# Patient Record
Sex: Male | Born: 1960 | Race: Black or African American | Hispanic: No | Marital: Single | Smoking: Never smoker
Health system: Southern US, Community
[De-identification: ages and names within clinical notes are randomized; demographics above are authoritative.]

---

## 2014-04-14 ENCOUNTER — Emergency Department (HOSPITAL_BASED_OUTPATIENT_CLINIC_OR_DEPARTMENT_OTHER): Payer: BC Managed Care – PPO

## 2014-04-14 ENCOUNTER — Emergency Department (HOSPITAL_BASED_OUTPATIENT_CLINIC_OR_DEPARTMENT_OTHER)
Admission: EM | Admit: 2014-04-14 | Discharge: 2014-04-14 | Disposition: A | Discharge status: Home / Self Care | Payer: BC Managed Care – PPO | Attending: Emergency Medicine | Admitting: Emergency Medicine

## 2014-04-14 ENCOUNTER — Encounter (HOSPITAL_BASED_OUTPATIENT_CLINIC_OR_DEPARTMENT_OTHER): Payer: Self-pay | Admitting: *Deleted

## 2014-04-14 DIAGNOSIS — Z880 Allergy status to penicillin: Secondary | ICD-10-CM | POA: Diagnosis not present

## 2014-04-14 DIAGNOSIS — K5731 Diverticulosis of large intestine without perforation or abscess with bleeding: Secondary | ICD-10-CM | POA: Diagnosis not present

## 2014-04-14 DIAGNOSIS — K625 Hemorrhage of anus and rectum: Secondary | ICD-10-CM | POA: Diagnosis present

## 2014-04-14 DIAGNOSIS — R52 Pain, unspecified: Secondary | ICD-10-CM

## 2014-04-14 LAB — COMPREHENSIVE METABOLIC PANEL
ALT: 35 U/L (ref 0–53)
ANION GAP: 13 (ref 5–15)
AST: 26 U/L (ref 0–37)
Albumin: 4.4 g/dL (ref 3.5–5.2)
Alkaline Phosphatase: 42 U/L (ref 39–117)
BUN: 10 mg/dL (ref 6–23)
CALCIUM: 9.3 mg/dL (ref 8.4–10.5)
CO2: 26 mEq/L (ref 19–32)
Chloride: 102 mEq/L (ref 96–112)
Creatinine, Ser: 1.2 mg/dL (ref 0.50–1.35)
GFR calc non Af Amer: 67 mL/min — ABNORMAL LOW (ref >90)
GFR, EST AFRICAN AMERICAN: 78 mL/min — AB (ref >90)
Glucose, Bld: 94 mg/dL (ref 70–99)
Potassium: 4 mEq/L (ref 3.7–5.3)
Sodium: 141 mEq/L (ref 137–147)
TOTAL PROTEIN: 7.8 g/dL (ref 6.0–8.3)
Total Bilirubin: 0.4 mg/dL (ref 0.3–1.2)

## 2014-04-14 LAB — OCCULT BLOOD X 1 CARD TO LAB, STOOL: Fecal Occult Bld: NEGATIVE

## 2014-04-14 LAB — CBC WITH DIFFERENTIAL/PLATELET
Basophils Absolute: 0.1 10*3/uL (ref 0.0–0.1)
Basophils Relative: 1 % (ref 0–1)
EOS ABS: 0.2 10*3/uL (ref 0.0–0.7)
EOS PCT: 3 % (ref 0–5)
HCT: 45.5 % (ref 39.0–52.0)
HEMOGLOBIN: 15.4 g/dL (ref 13.0–17.0)
Lymphocytes Relative: 45 % (ref 12–46)
Lymphs Abs: 3.1 10*3/uL (ref 0.7–4.0)
MCH: 27.3 pg (ref 26.0–34.0)
MCHC: 33.8 g/dL (ref 30.0–36.0)
MCV: 80.7 fL (ref 78.0–100.0)
MONOS PCT: 8 % (ref 3–12)
Monocytes Absolute: 0.5 10*3/uL (ref 0.1–1.0)
Neutro Abs: 3 10*3/uL (ref 1.7–7.7)
Neutrophils Relative %: 43 % (ref 43–77)
PLATELETS: 243 10*3/uL (ref 150–400)
RBC: 5.64 MIL/uL (ref 4.22–5.81)
RDW: 14.8 % (ref 11.5–15.5)
WBC: 6.8 10*3/uL (ref 4.0–10.5)

## 2014-04-14 MED ORDER — IOHEXOL 300 MG/ML  SOLN
100.0000 mL | Freq: Once | INTRAMUSCULAR | Status: AC | PRN
  Administered 2014-04-14: 100 mL via INTRAVENOUS

## 2014-04-14 NOTE — Discharge Instructions (Signed)
Diverticulosis Call Dr. Randa EvensEdwards tomorrow to schedule an office visit for within the next few weeks. Tell office staff that you were seen here and had a CT scan done here. Call your primary care physician tomorrow to arrange to get your blood pressure recheck within the next 3 weeks. Today's was elevated at 151/80. Diverticulosis is the condition that develops when small pouches (diverticula) form in the wall of your colon. Your colon, or large intestine, is where water is absorbed and stool is formed. The pouches form when the inside layer of your colon pushes through weak spots in the outer layers of your colon. CAUSES  No one knows exactly what causes diverticulosis. RISK FACTORS  Being older than 50. Your risk for this condition increases with age. Diverticulosis is rare in people younger than 40 years. By age 54, almost everyone has it.  Eating a low-fiber diet.  Being frequently constipated.  Being overweight.  Not getting enough exercise.  Smoking.  Taking over-the-counter pain medicines, like aspirin and ibuprofen. SYMPTOMS  Most people with diverticulosis do not have symptoms. DIAGNOSIS  Because diverticulosis often has no symptoms, health care providers often discover the condition during an exam for other colon problems. In many cases, a health care provider will diagnose diverticulosis while using a flexible scope to examine the colon (colonoscopy). TREATMENT  If you have never developed an infection related to diverticulosis, you may not need treatment. If you have had an infection before, treatment may include:  Eating more fruits, vegetables, and grains.  Taking a fiber supplement.  Taking a live bacteria supplement (probiotic).  Taking medicine to relax your colon. HOME CARE INSTRUCTIONS   Drink at least 6-8 glasses of water each day to prevent constipation.  Try not to strain when you have a bowel movement.  Keep all follow-up appointments. If you have had an  infection before:  Increase the fiber in your diet as directed by your health care provider or dietitian.  Take a dietary fiber supplement if your health care provider approves.  Only take medicines as directed by your health care provider. SEEK MEDICAL CARE IF:   You have abdominal pain.  You have bloating.  You have cramps.  You have not gone to the bathroom in 3 days. SEEK IMMEDIATE MEDICAL CARE IF:   Your pain gets worse.  Yourbloating becomes very bad.  You have a fever or chills, and your symptoms suddenly get worse.  You begin vomiting.  You have bowel movements that are bloody or black. MAKE SURE YOU:  Understand these instructions.  Will watch your condition.  Will get help right away if you are not doing well or get worse. Document Released: 02/04/2004 Document Revised: 05/14/2013 Document Reviewed: 04/03/2013 [facility] Patient Information 2015 [place]. This information is not intended to replace advice given to you by your health care provider. Make sure you discuss any questions you have with your health care provider.

## 2014-04-14 NOTE — ED Notes (Signed)
Patient transported to CT 

## 2014-04-14 NOTE — ED Provider Notes (Signed)
CSN: [id number]     Arrival date & time 04/14/14  1749 History  This chart was scribed for Keith SouSam Sevannah Madia, MD by Annye AsaAnna Dorsett, ED Scribe. This patient was seen in room MH05/[facility]05 and the patient's care was started at 6:09 PM.    Chief Complaint  Patient presents with  . Rectal Bleeding   The history is provided by the patient. No language interpreter was used.     HPI Comments: Keith Beltran is a 53 y.o. male who presents to the Emergency Department complaining of rectal bleeding and lower abdominal pain; he has no discomfort at present. He reports that on the morning of 04/10/14 he first noted bright red blood when wiping after a bowel movement; he has noticed blood in a similar manner with every bowel movement since. No gross blood noted in bowel movement, only blood on toilet paper with wiping  He reports 1 week of lower abdominal pain. His pain is described as "heavy" and is particularly noticeable with change in position (seated to standing, etc) and when firmly palpated (patient saw Dr. Modesto CharonWong just PTA; he had bloodwork at that office visit).  CBC which showed white count 6.1, hemoglobin 15.7 hematocrit 49.6, platelet count 231 He also notes lower back pain for one week. He denies appetite change or fever.  No pain at present. No lightheadedness. No treatment prior to coming here.  He denies any other health problems at present or history of health problems. He had an operation concerning his urinary function when he was 89 or 53 y/o; he is unsure what this was.   Patient denies smoking, drinking, or the use of illegal drugs.  History reviewed. No pertinent past medical history. History reviewed. No pertinent past surgical history. No family history on file. History  Substance Use Topics  . Smoking status: Never Smoker   . Smokeless tobacco: Not on file  . Alcohol Use: No    Review of Systems  Constitutional: Negative.   HENT: Negative.   Respiratory: Negative.   Cardiovascular:  Negative.   Gastrointestinal: Positive for abdominal pain and anal bleeding.  Musculoskeletal: Positive for back pain.  Skin: Negative.   Neurological: Negative.   Psychiatric/Behavioral: Negative.   All other systems reviewed and are negative.   Allergies  Penicillins  Home Medications   Prior to Admission medications   Not on File   BP 155/100 mmHg  Pulse 88  Temp(Src) 98.3 F (36.8 C) (Oral)  Resp 18  Ht 6\' 2"  (1.88 m)  Wt 266 lb (120.657 kg)  BMI 34.14 kg/m2  SpO2 97% Physical Exam  Constitutional: He appears well-developed and well-nourished.  HENT:  Head: Normocephalic and atraumatic.  Eyes: Conjunctivae are normal. Pupils are equal, round, and reactive to light.  Neck: Neck supple. No tracheal deviation present. No thyromegaly present.  Cardiovascular: Normal rate and regular rhythm.   No murmur heard. Pulmonary/Chest: Effort normal and breath sounds normal.  Abdominal: Soft. Bowel sounds are normal. He exhibits no distension. There is no tenderness.  Obese, umbilical hernia, soft nontender easily reducible, mild tenderness over bilateral lower quadrants  Genitourinary: Rectum normal and penis normal. Guaiac negative stool.  Brown stool no gross blood  Musculoskeletal: Normal range of motion. He exhibits no edema or tenderness.  Neurological: He is alert. Coordination normal.  Skin: Skin is warm and dry. No rash noted.  Psychiatric: He has a normal mood and affect.  Nursing note and vitals reviewed.   ED Course  Procedures   DIAGNOSTIC STUDIES:  Oxygen Saturation is 97% on RA, normal by my interpretation.    COORDINATION OF CARE: 6:17 PM Discussed treatment plan with pt at bedside and pt agreed to plan.   Labs Review Labs Reviewed  OCCULT BLOOD X 1 CARD TO LAB, STOOL  CBC WITH DIFFERENTIAL  COMPREHENSIVE METABOLIC PANEL    Imaging Review No results found.   EKG Interpretation None     9:40 PM patient resting comfortably. Results for orders  placed or performed during the hospital encounter of 04/14/14  Occult blood card to lab, stool  Result Value Ref Range   Fecal Occult Bld NEGATIVE NEGATIVE  CBC with Differential  Result Value Ref Range   WBC 6.8 4.0 - 10.5 K/uL   RBC 5.64 4.22 - 5.81 MIL/uL   Hemoglobin 15.4 13.0 - 17.0 g/dL   HCT 91.4 78.2 - 95.6 %   MCV 80.7 78.0 - 100.0 fL   MCH 27.3 26.0 - 34.0 pg   MCHC 33.8 30.0 - 36.0 g/dL   RDW 21.3 08.6 - 57.8 %   Platelets 243 150 - 400 K/uL   Neutrophils Relative % 43 43 - 77 %   Neutro Abs 3.0 1.7 - 7.7 K/uL   Lymphocytes Relative 45 12 - 46 %   Lymphs Abs 3.1 0.7 - 4.0 K/uL   Monocytes Relative 8 3 - 12 %   Monocytes Absolute 0.5 0.1 - 1.0 K/uL   Eosinophils Relative 3 0 - 5 %   Eosinophils Absolute 0.2 0.0 - 0.7 K/uL   Basophils Relative 1 0 - 1 %   Basophils Absolute 0.1 0.0 - 0.1 K/uL  Comprehensive metabolic panel  Result Value Ref Range   Sodium 141 137 - 147 mEq/L   Potassium 4.0 3.7 - 5.3 mEq/L   Chloride 102 96 - 112 mEq/L   CO2 26 19 - 32 mEq/L   Glucose, Bld 94 70 - 99 mg/dL   BUN 10 6 - 23 mg/dL   Creatinine, Ser 4.69 0.50 - 1.35 mg/dL   Calcium 9.3 8.4 - 62.9 mg/dL   Total Protein 7.8 6.0 - 8.3 g/dL   Albumin 4.4 3.5 - 5.2 g/dL   AST 26 0 - 37 U/L   ALT 35 0 - 53 U/L   Alkaline Phosphatase 42 39 - 117 U/L   Total Bilirubin 0.4 0.3 - 1.2 mg/dL   GFR calc non Af Amer 67 (L) >90 mL/min   GFR calc Af Amer 78 (L) >90 mL/min   Anion gap 13 5 - 15   Ct Abdomen Pelvis W Contrast  04/14/2014   CLINICAL DATA:  Bright red blood per rectum for 4 days  EXAM: CT ABDOMEN AND PELVIS WITH CONTRAST  TECHNIQUE: Multidetector CT imaging of the abdomen and pelvis was performed using the standard protocol following bolus administration of intravenous contrast.  CONTRAST:  OMNIPAQUE IOHEXOL 300 MG/ML  SOLN  COMPARISON:  None.  FINDINGS: Lung bases are free of acute infiltrate or sizable effusion.  The liver is diffusely fatty infiltrated. A peripherally  enhancing lesion is noted within the posterior aspect of the right lobe of the liver as well as a few smaller similar lesions near the falciform ligament. These are consistent with hemangiomas.  The spleen, adrenal glands, gallbladder, kidneys and pancreas are within normal limits with the exception of a tiny nonobstructing stone in the upper pole of the right kidney.  The bladder is well distended. Diverticulosis without evidence of diverticulitis is noted. No definitive colonic mass  lesion is noted. The osseous structures show no acute abnormality.  IMPRESSION: Diverticulosis without diverticulitis.  Multiple hepatic hemangiomas.  Nonobstructing right renal stone.   Electronically Signed   By: Alcide CleverMark  Lukens M.D.   On: 04/14/2014 19:52    MDM  Plan follow-up with primary care physician or with Dr. Randa EvensEdwards if bleeding continues. Blood pressure recheck 3 weeks Final diagnoses:  None   diagnosis #1 #2 elevated blood pressure   I personally performed the services described in this documentation, which was scribed in my presence. The recorded information has been reviewed and considered.     Keith SouSam Muhammadali Ries, MD 04/14/14 2150

## 2014-04-14 NOTE — ED Notes (Signed)
Bright red rectal bleeding x 4 days. He has noticed blood on the tissue when he wipes.  Lower abdominal pain.

## 2015-01-28 ENCOUNTER — Ambulatory Visit
Admission: RE | Admit: 2015-01-28 | Discharge: 2015-01-28 | Disposition: A | Discharge status: Home / Self Care | Payer: BC Managed Care – PPO | Source: Ambulatory Visit | Attending: Family Medicine | Admitting: Family Medicine

## 2015-01-28 ENCOUNTER — Other Ambulatory Visit: Payer: Self-pay | Admitting: Family Medicine

## 2015-01-28 DIAGNOSIS — M545 Low back pain: Secondary | ICD-10-CM

## 2016-08-08 IMAGING — CT CT ABD-PELV W/ CM
1 of 4 series · 14 of 32 positions shown, 19 images · IV contrast (APPLIED)
Comparison: None.

CLINICAL DATA: Bright red blood per rectum for 4 days

EXAM:
CT ABDOMEN AND PELVIS WITH CONTRAST
TECHNIQUE: Multidetector CT imaging of the abdomen and pelvis was performed
using the standard protocol following bolus administration of
intravenous contrast.
CONTRAST:  100mL OMNIPAQUE IOHEXOL 300 MG/ML  SOLN

[Series 2: abd/pelvis 5.0 b31f · axial · 0.79mm/px · z∈[-475,-30]mm · 14 of 101 slices shown, 19 images]
[im 6/101  soft-tissue]
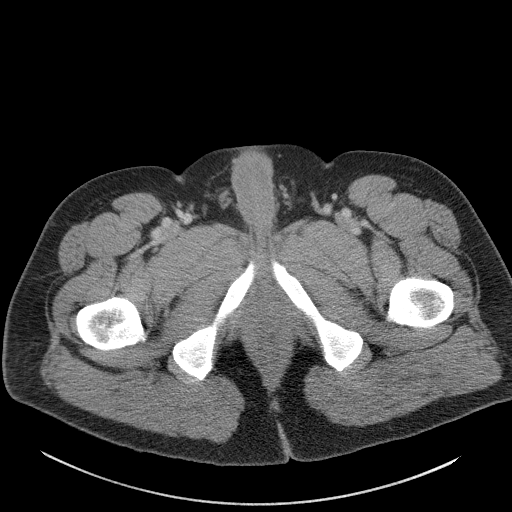
[im 6/101  bone]
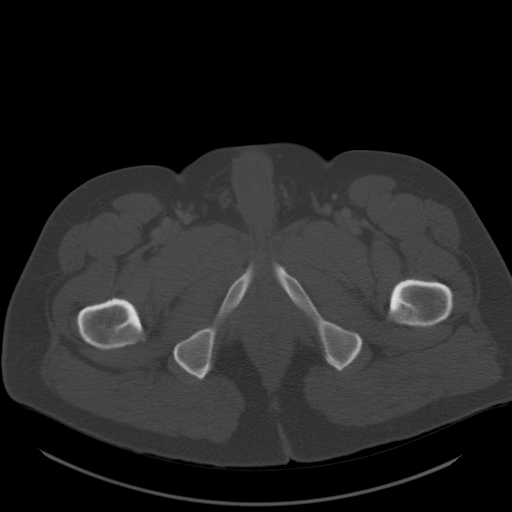
[im 16/101  soft-tissue]
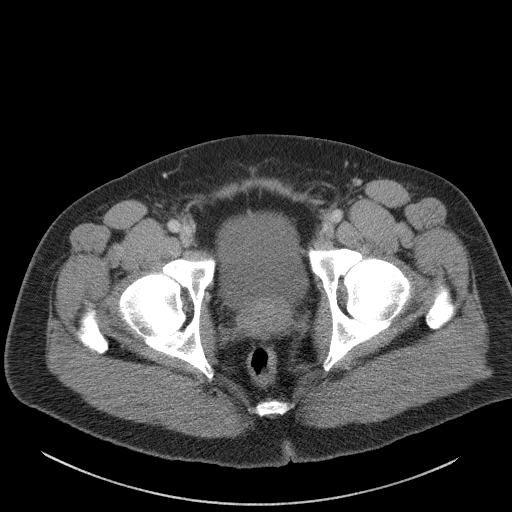
[im 22/101  soft-tissue]
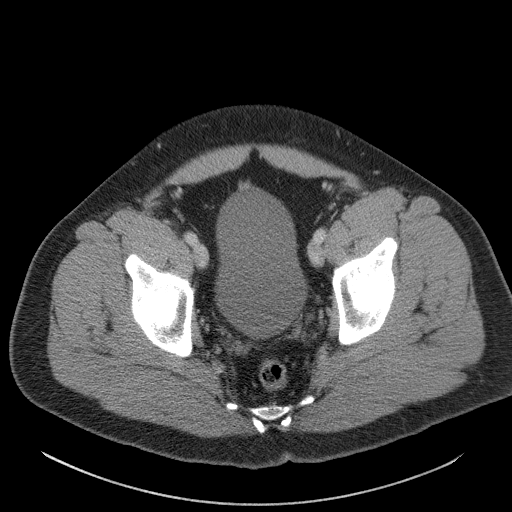
[im 27/101  soft-tissue]
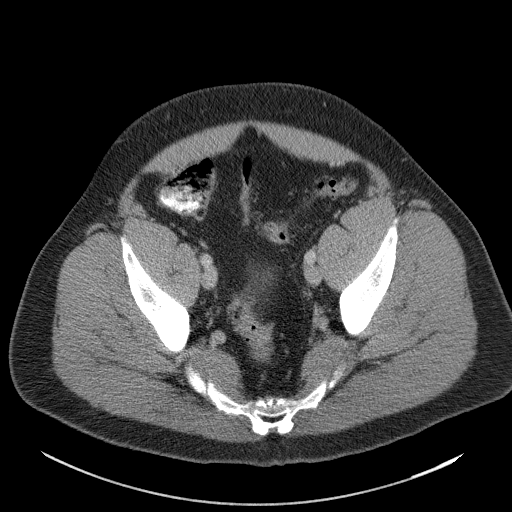
[im 37/101  soft-tissue]
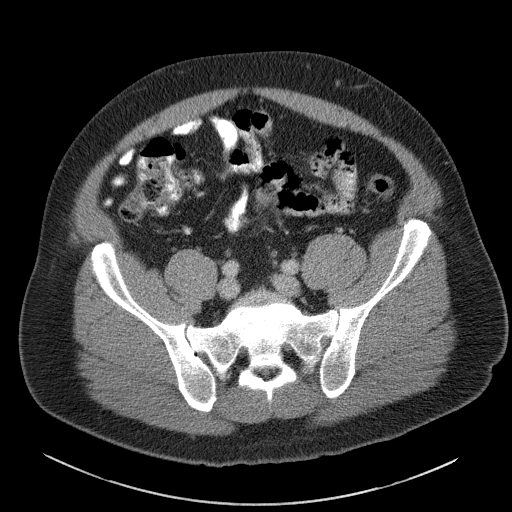
[im 43/101  soft-tissue]
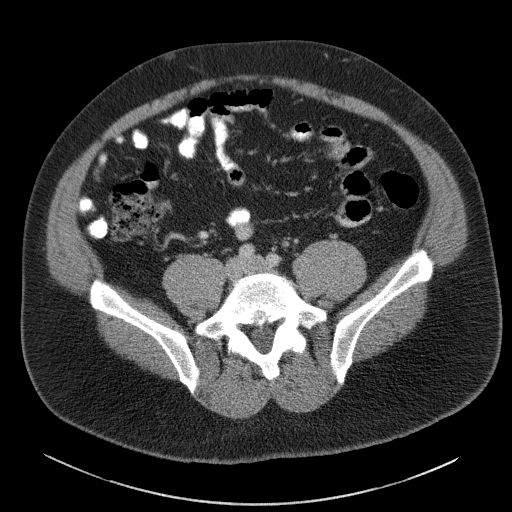
[im 53/101  soft-tissue]
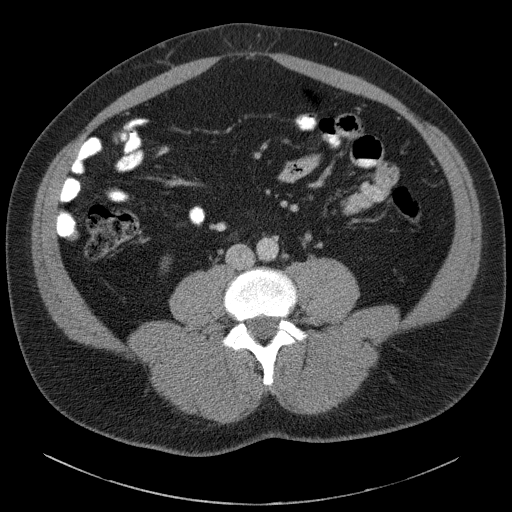
[im 58/101  soft-tissue]
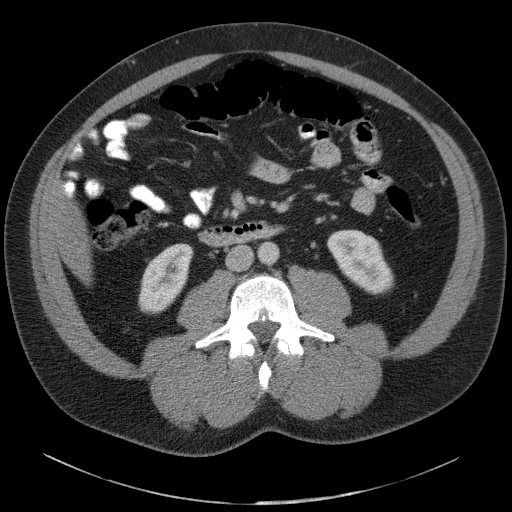
[im 64/101  soft-tissue]
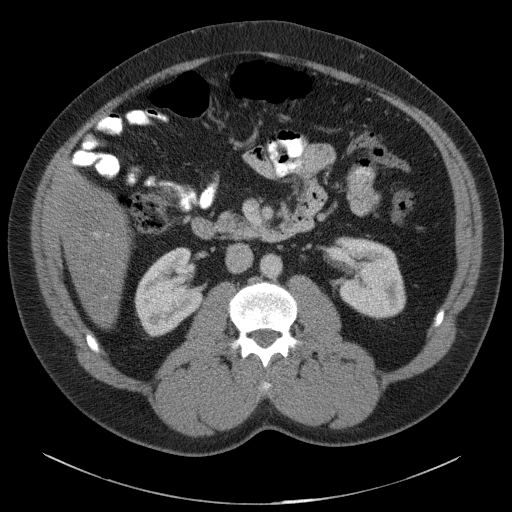
[im 64/101  bone]
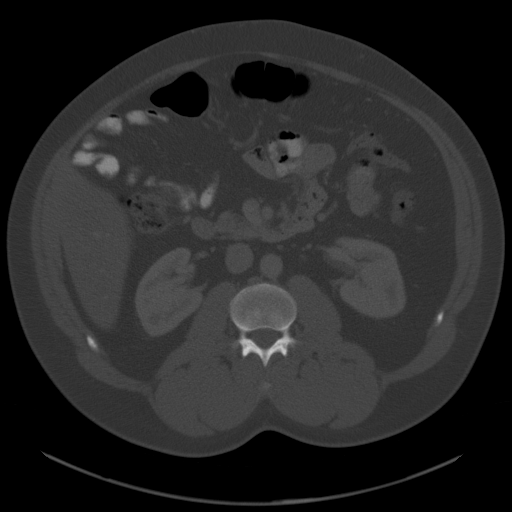
[im 74/101  soft-tissue]
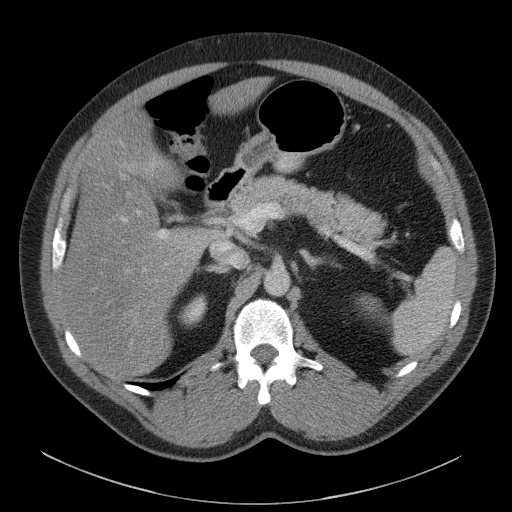
[im 79/101  soft-tissue]
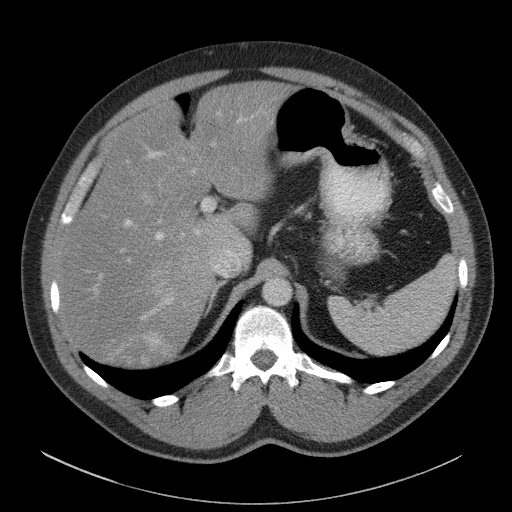
[im 79/101  lung]
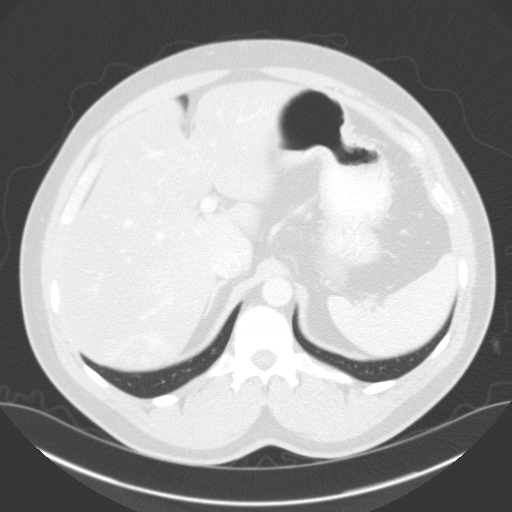
[im 85/101  soft-tissue]
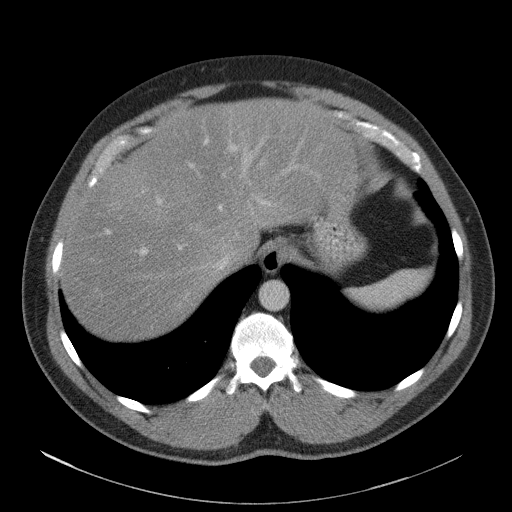
[im 85/101  lung]
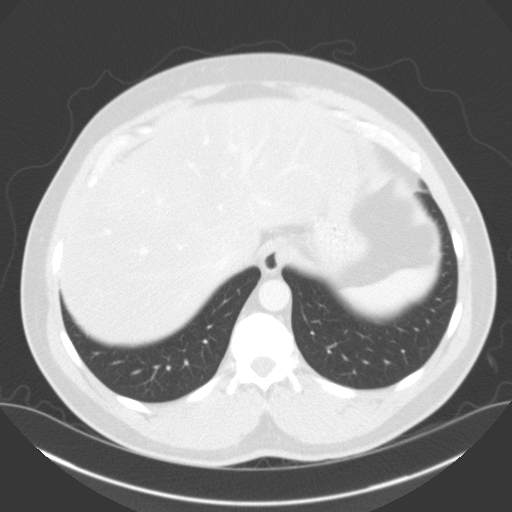
[im 90/101  lung]
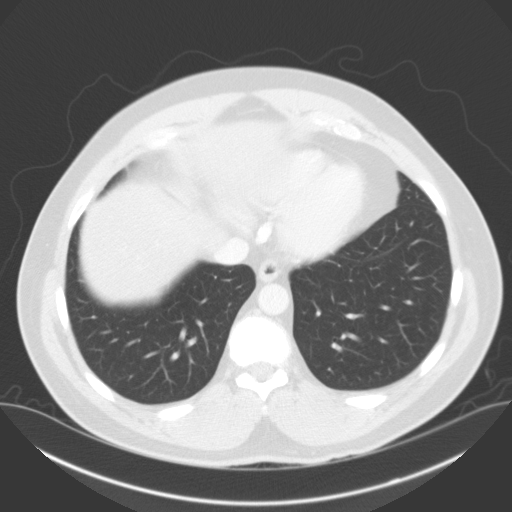
[im 95/101  soft-tissue]
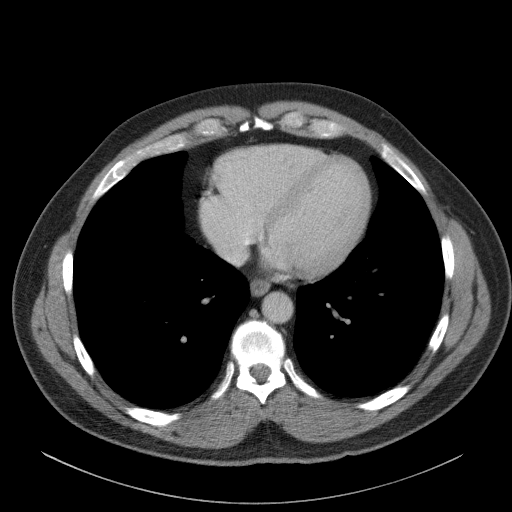
[im 95/101  lung]
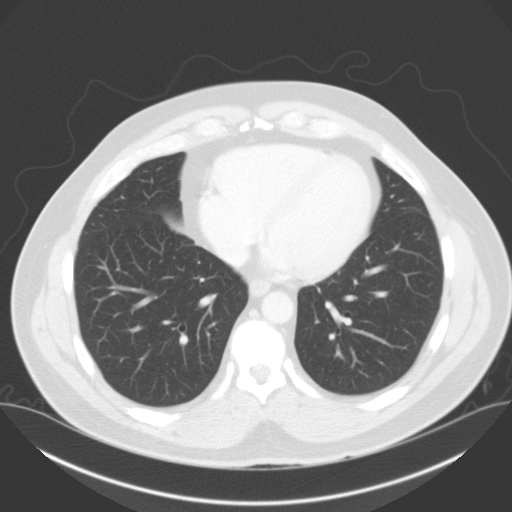

[14 of 32 positions shown; findings below may reference images not displayed]

FINDINGS: Lung bases are free of acute infiltrate or sizable effusion.

The liver is diffusely fatty infiltrated. A peripherally enhancing
lesion is noted within the posterior aspect of the right lobe of the
liver as well as a few smaller similar lesions near the falciform
ligament. These are consistent with hemangiomas.

The spleen, adrenal glands, gallbladder, kidneys and pancreas are
within normal limits with the exception of a tiny nonobstructing
stone in the upper pole of the right kidney.

The bladder is well distended. Diverticulosis without evidence of
diverticulitis is noted. No definitive colonic mass lesion is noted.
The osseous structures show no acute abnormality.
IMPRESSION: Diverticulosis without diverticulitis.

Multiple hepatic hemangiomas.

Nonobstructing right renal stone.

## 2017-05-24 IMAGING — CR DG LUMBAR SPINE 2-3V
3 series · 3 of 3 positions shown · non-contrast
Comparison: Coronal and sagittal images through the lumbar spine
from an abdominal and pelvic CT scan April 15, 2015.

CLINICAL DATA: Low back pain radiating into the left leg for the
past 3-4 weeks without known trauma

EXAM:
LUMBAR SPINE - 2-3 VIEW

[t l-spine a.p.]
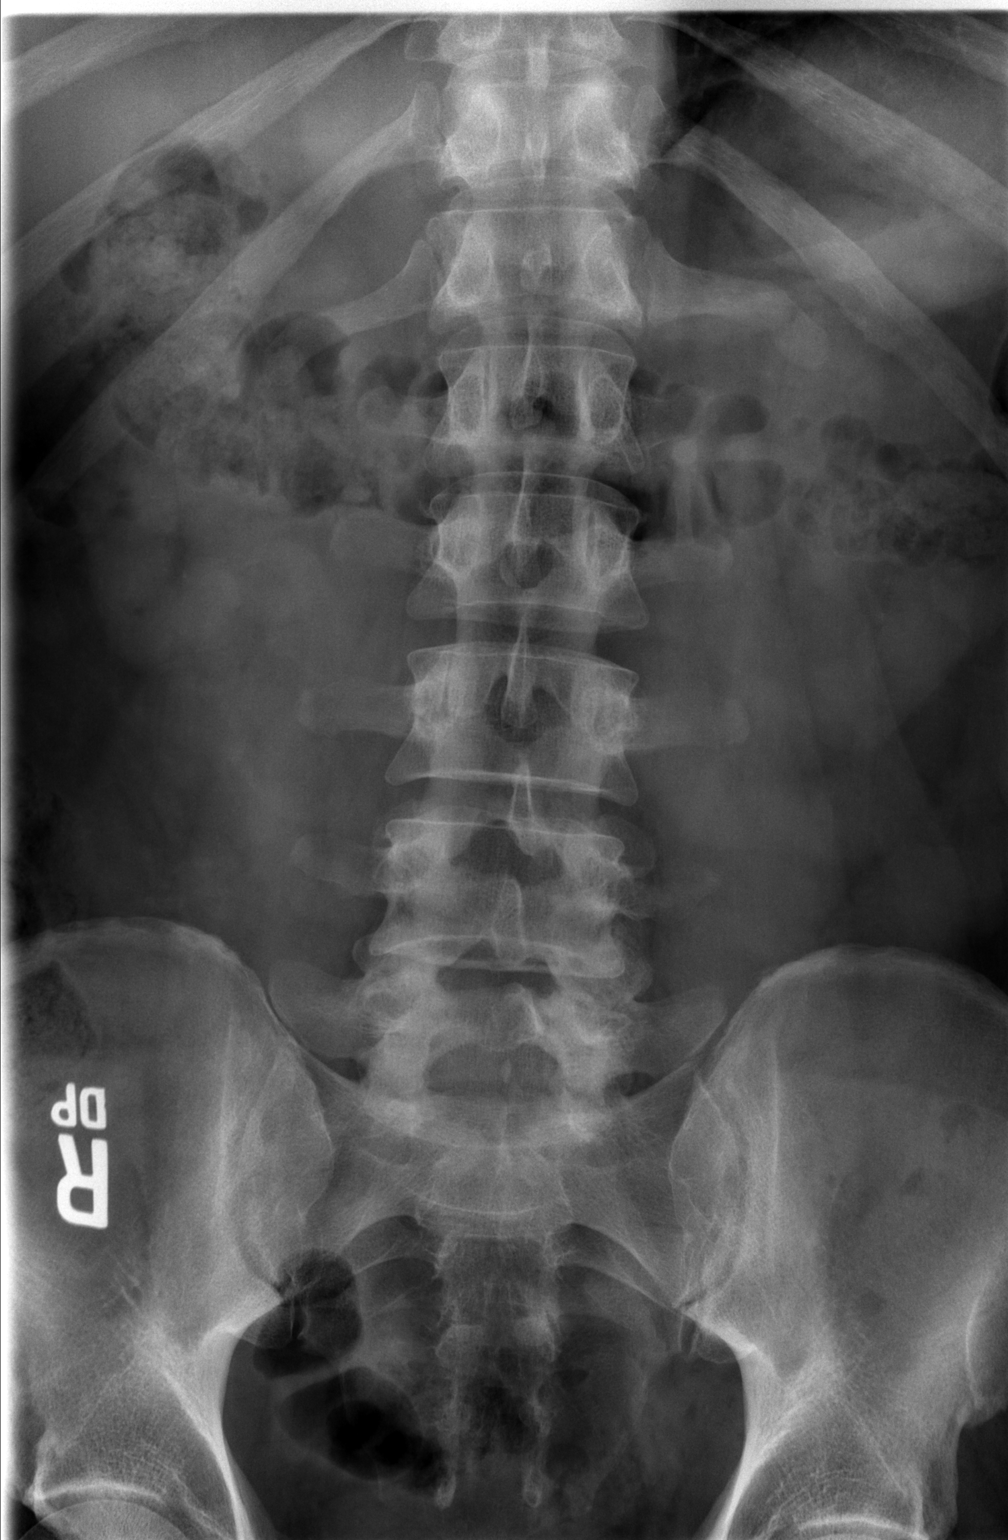

[t l-spine lat]
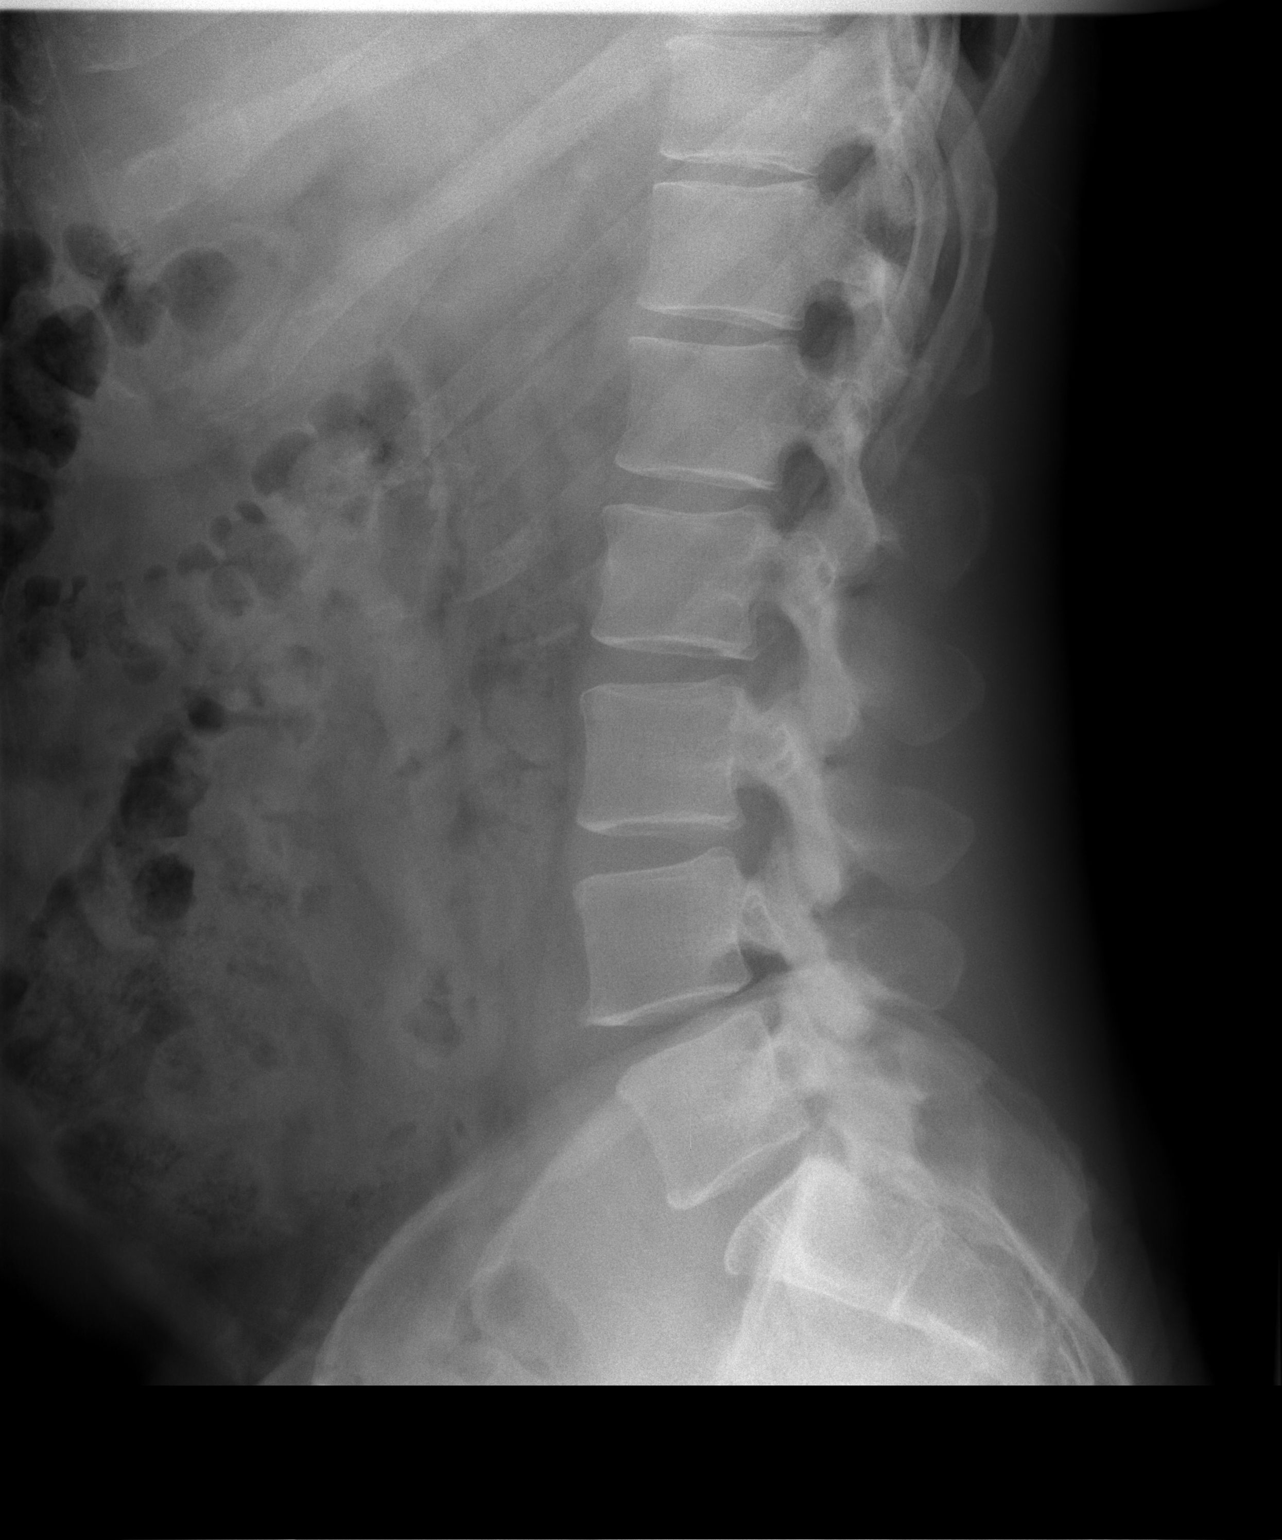

[t l-spine l5-s1 spot]
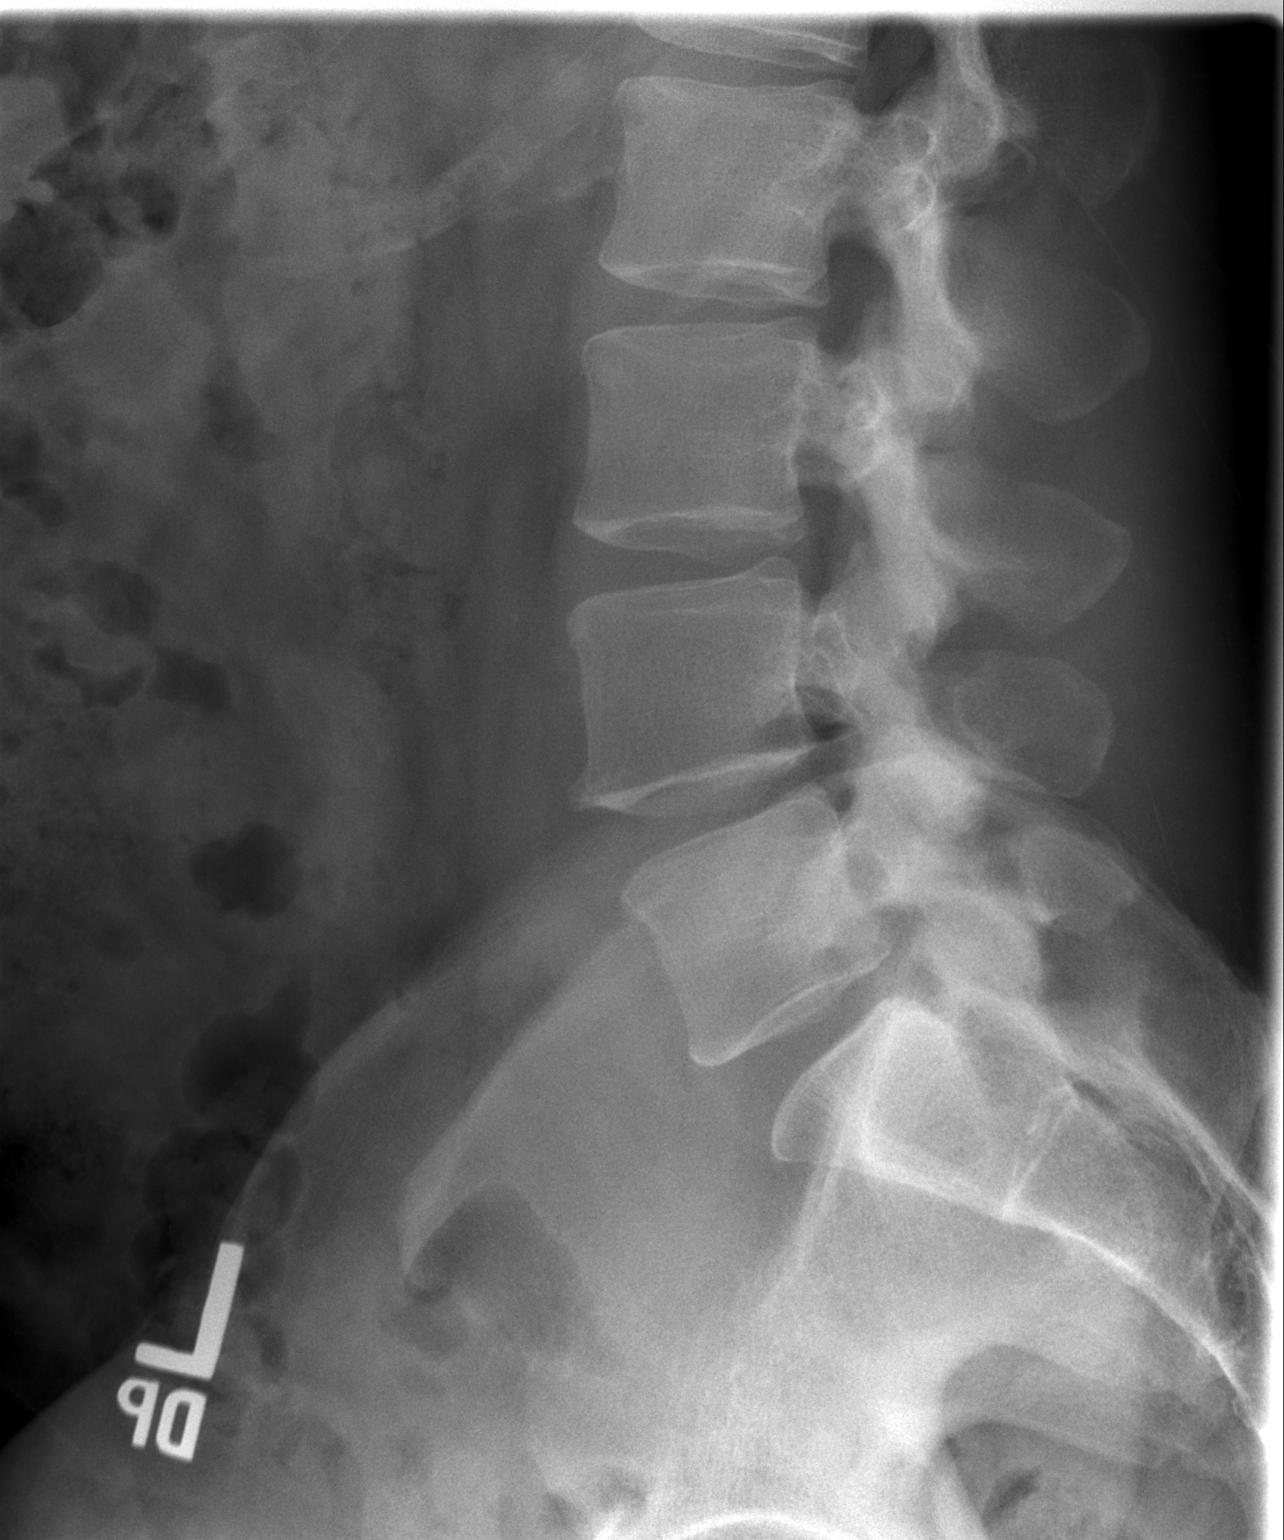

[3 of 3 positions shown; findings below may reference images not displayed]

FINDINGS: For purposes of this study there are assumed to be 5 lumbar type
non-rib-bearing vertebral bodies. The twelfth ribs appear
hypoplastic.

The lumbar vertebral bodies are preserved in height. The disc space
heights are reasonably well-maintained. There is mild facet joint
hypertrophy at L4-5 and at L5-S1. There is no spondylolisthesis. The
pedicles and transverse processes are intact. The observed portions
of the sacrum are normal.
IMPRESSION: There is no acute or significant chronic bony abnormality of the
lumbar spine. Given the patient's radicular symptoms, further
evaluation with lumbar MRI is recommended to assess the status of
the intervertebral discs and the nerve roots.
